# Patient Record
Sex: Female | Born: 1960 | Race: White | Hispanic: No | Marital: Married | Smoking: Current every day smoker
Health system: Southern US, Community
[De-identification: ages and names within clinical notes are randomized; demographics above are authoritative.]

## PROBLEM LIST (undated history)

## (undated) DIAGNOSIS — T7840XA Allergy, unspecified, initial encounter: Secondary | ICD-10-CM

## (undated) DIAGNOSIS — F419 Anxiety disorder, unspecified: Secondary | ICD-10-CM

## (undated) HISTORY — DX: Anxiety disorder, unspecified: F41.9

## (undated) HISTORY — DX: Allergy, unspecified, initial encounter: T78.40XA

## (undated) HISTORY — PX: ABDOMINAL EXPLORATION SURGERY: SHX538

## (undated) HISTORY — PX: APPENDECTOMY: SHX54

---

## 1988-09-02 HISTORY — PX: NOSE SURGERY: SHX723

## 2003-08-17 ENCOUNTER — Other Ambulatory Visit: Admission: RE | Admit: 2003-08-17 | Discharge: 2003-08-17 | Payer: Self-pay | Admitting: *Deleted

## 2004-06-13 ENCOUNTER — Ambulatory Visit (HOSPITAL_COMMUNITY): Admission: RE | Admit: 2004-06-13 | Discharge: 2004-06-13 | Payer: Self-pay

## 2009-12-13 ENCOUNTER — Encounter: Admission: RE | Admit: 2009-12-13 | Discharge: 2009-12-13 | Payer: Self-pay | Admitting: Obstetrics & Gynecology

## 2009-12-21 ENCOUNTER — Ambulatory Visit: Admission: RE | Admit: 2009-12-21 | Discharge: 2009-12-21 | Payer: Self-pay | Admitting: Gynecologic Oncology

## 2010-01-09 ENCOUNTER — Encounter (INDEPENDENT_AMBULATORY_CARE_PROVIDER_SITE_OTHER): Payer: Self-pay | Admitting: Obstetrics & Gynecology

## 2010-01-09 ENCOUNTER — Inpatient Hospital Stay (HOSPITAL_COMMUNITY): Admission: RE | Admit: 2010-01-09 | Discharge: 2010-01-11 | Payer: Self-pay | Admitting: Obstetrics & Gynecology

## 2010-11-20 LAB — CBC
HCT: 41.8 % (ref 36.0–46.0)
Hemoglobin: 13.9 g/dL (ref 12.0–15.0)
MCHC: 33.4 g/dL (ref 30.0–36.0)
MCHC: 33.4 g/dL (ref 30.0–36.0)
Platelets: 276 10*3/uL (ref 150–400)
RBC: 3.99 MIL/uL (ref 3.87–5.11)

## 2010-11-20 LAB — BASIC METABOLIC PANEL
CO2: 27 mEq/L (ref 19–32)
Creatinine, Ser: 0.49 mg/dL (ref 0.4–1.2)
GFR calc Af Amer: >60 mL/min (ref >60)
GFR calc non Af Amer: >60 mL/min (ref >60)
Glucose, Bld: 100 mg/dL — ABNORMAL HIGH (ref 70–99)

## 2010-11-20 LAB — DIFFERENTIAL
Basophils Absolute: 0.1 10*3/uL (ref 0.0–0.1)
Eosinophils Absolute: 0.1 10*3/uL (ref 0.0–0.7)
Eosinophils Relative: 1 % (ref 0–5)
Lymphs Abs: 2.6 10*3/uL (ref 0.7–4.0)
Monocytes Relative: 15 % — ABNORMAL HIGH (ref 3–12)
Neutro Abs: 7.1 10*3/uL (ref 1.7–7.7)

## 2010-11-20 LAB — COMPREHENSIVE METABOLIC PANEL
ALT: 14 U/L (ref 0–35)
AST: 18 U/L (ref 0–37)
BUN: 10 mg/dL (ref 6–23)
CO2: 27 mEq/L (ref 19–32)
Calcium: 9.2 mg/dL (ref 8.4–10.5)
Creatinine, Ser: 0.55 mg/dL (ref 0.4–1.2)
Total Protein: 7.3 g/dL (ref 6.0–8.3)

## 2010-11-20 LAB — CA 125: CA 125: 42.9 U/mL — ABNORMAL HIGH (ref 0.0–30.2)

## 2010-11-20 LAB — PREGNANCY, URINE: Preg Test, Ur: NEGATIVE

## 2010-11-20 LAB — ABO/RH: ABO/RH(D): A POS

## 2011-01-01 HISTORY — PX: ABDOMINAL HYSTERECTOMY: SHX81

## 2011-09-27 ENCOUNTER — Ambulatory Visit (INDEPENDENT_AMBULATORY_CARE_PROVIDER_SITE_OTHER): Payer: Commercial Indemnity | Admitting: Family Medicine

## 2011-09-27 DIAGNOSIS — N949 Unspecified condition associated with female genital organs and menstrual cycle: Secondary | ICD-10-CM

## 2011-09-27 DIAGNOSIS — F17200 Nicotine dependence, unspecified, uncomplicated: Secondary | ICD-10-CM

## 2012-05-02 ENCOUNTER — Ambulatory Visit (INDEPENDENT_AMBULATORY_CARE_PROVIDER_SITE_OTHER): Payer: Commercial Indemnity | Admitting: Family Medicine

## 2012-05-02 VITALS — BP 125/83 | HR 83 | Temp 97.8°F | Resp 18 | Ht 66.5 in | Wt 170.0 lb

## 2012-05-02 DIAGNOSIS — Z79899 Other long term (current) drug therapy: Secondary | ICD-10-CM

## 2012-05-02 DIAGNOSIS — B351 Tinea unguium: Secondary | ICD-10-CM

## 2012-05-02 DIAGNOSIS — J069 Acute upper respiratory infection, unspecified: Secondary | ICD-10-CM

## 2012-05-02 DIAGNOSIS — J309 Allergic rhinitis, unspecified: Secondary | ICD-10-CM

## 2012-05-02 LAB — COMPREHENSIVE METABOLIC PANEL
AST: 17 U/L (ref 0–37)
Alkaline Phosphatase: 99 U/L (ref 39–117)
BUN: 11 mg/dL (ref 6–23)
CO2: 26 mEq/L (ref 19–32)
Creat: 0.58 mg/dL (ref 0.50–1.10)
Glucose, Bld: 91 mg/dL (ref 70–99)
Potassium: 4.3 mEq/L (ref 3.5–5.3)
Sodium: 139 mEq/L (ref 135–145)
Total Bilirubin: 0.5 mg/dL (ref 0.3–1.2)

## 2012-05-02 MED ORDER — TERBINAFINE HCL 250 MG PO TABS: 250.0000 mg | ORAL_TABLET | Freq: Every day | ORAL | Status: AC

## 2012-05-02 MED ORDER — IPRATROPIUM BROMIDE 0.06 % NA SOLN: 2.0000 | Freq: Four times a day (QID) | NASAL | Status: DC | PRN

## 2012-05-02 MED ORDER — AZITHROMYCIN 250 MG PO TABS: ORAL_TABLET | ORAL | Status: AC

## 2012-05-02 NOTE — Progress Notes (Signed)
  Subjective:    Patient ID: Lori Myers, female    DOB: [birth year], 51 y.o.   MRN: [id number]  HPI Lori Myers is a 51 y.o. female  Started few days ago - sneezing and runny nose, pnd and hoarse voice.  Hx of seasonal allergies, takes [facility] brand antihistamine - past few days.  Usually flair of allergies in spring and fall.  No fever.  Husband with similar symptoms.  Discolored/thickened toenails great toe both feet - past 3 months.  Tried 3 different otc treatments - no relief.   TX:  otc antihistamine, fluids.    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  Review of Systems     Objective:   Physical Exam  Constitutional: She is oriented to person, place, and time. She appears well-developed and well-nourished. No distress.  HENT:  Head: Normocephalic and atraumatic.  Right Ear: Hearing, tympanic membrane, external ear and ear canal normal.  Left Ear: Hearing, tympanic membrane, external ear and ear canal normal.  Nose: Nose normal.  Mouth/Throat: Oropharynx is clear and moist. No oropharyngeal exudate.  Eyes: Conjunctivae and EOM are normal. Pupils are equal, round, and reactive to light.  Cardiovascular: Normal rate, regular rhythm, normal heart sounds and intact distal pulses.   No murmur heard. Pulmonary/Chest: Effort normal and breath sounds normal. No respiratory distress. She has no wheezes. She has no rhonchi.  Neurological: She is alert and oriented to person, place, and time.  Skin: Skin is warm and dry. No rash noted.       Thickened, discolored toenails - R great toe, 5th toe, and L great toe, 2nd toe and 5th toe.  No surrounding erythema.   Psychiatric: She has a normal mood and affect. Her behavior is normal.       Assessment & Plan:  Lori Myers is a 51 y.o. female 1. Allergic rhinitis    2. URI (upper respiratory infection)  azithromycin (ZITHROMAX) 250 MG tablet  3. Onychomycosis  terbinafine  (LAMISIL) 250 MG tablet  4. High risk medication use  Comprehensive metabolic panel   URI vs allergy flair - saline ns, continue otc antihistamine.  Trial of atrovent ns if needed.  z pak if not improving in 3-4 days (augmentin allergy), but discussed likely allergic or viral cause. rtc precautions. Tobacco cessation recommended.  Onychomycosis - options discussed.  Start lamisil 250mg  qd.  Recheck cmp in 6 weeks and if ok - call in # 30 additional lamisil.

## 2012-05-02 NOTE — Patient Instructions (Addendum)
Continue antihistamine for allergies.  Saline nasal spray atleast 4 times per day, over the counter mucinex or mucinex DM if needed for cough, drink plenty of fluids. If not improving in 3-4 days, can start antibiotic. Return to the clinic or go to the nearest emergency room if any of your symptoms worsen or new symptoms occur.  Your should receive a call or letter about your lab results within the next week to 10 days.  Lab only visit in 6 weeks for liver test.  If that test is normal, we can call in your last month of Lamisil.   Ringworm, Nail A fungal infection of the nail (tinea unguium/onychomycosis) is common. It is common as the visible part of the nail is composed of dead cells which have no blood supply to help prevent infection. It occurs because fungi are everywhere and will pick any opportunity to grow on any dead material. Because nails are very slow growing they require up to 2 years of treatment with anti-fungal medications. The entire nail back to the base is infected. This includes approximately ? of the nail which you cannot see. If your caregiver has prescribed a medication by mouth, take it every day and as directed. No progress will be seen for at least 6 to 9 months. Do not be disappointed! Because fungi live on dead cells with little or no exposure to blood supply, medication delivery to the infection is slow; thus the cure is slow. It is also why you can observe no progress in the first 6 months. The nail becoming cured is the base of the nail, as it has the blood supply. Topical medication such as creams and ointments are usually not effective. Important in successful treatment of nail fungus is closely following the medication regimen that your doctor prescribes. Sometimes you and your caregiver may elect to speed up this process by surgical removal of all the nails. Even this may still require 6 to 9 months of additional oral medications. See your caregiver as directed. Remember  there will be no visible improvement for at least 6 months. See your caregiver sooner if other signs of infection (redness and swelling) develop. Document Released: 08/16/2000 Document Revised: 08/08/2011 Document Reviewed: 10/25/2008 [facility] Patient Information 2012 [place], [place].

## 2012-06-08 ENCOUNTER — Ambulatory Visit (INDEPENDENT_AMBULATORY_CARE_PROVIDER_SITE_OTHER): Payer: Commercial Indemnity | Admitting: Family Medicine

## 2012-06-08 VITALS — BP 130/84 | HR 67 | Resp 20 | Ht 68.0 in | Wt 178.0 lb

## 2012-06-08 DIAGNOSIS — Z23 Encounter for immunization: Secondary | ICD-10-CM

## 2012-06-08 DIAGNOSIS — M545 Low back pain, unspecified: Secondary | ICD-10-CM

## 2012-06-08 MED ORDER — METAXALONE 800 MG PO TABS: 800.0000 mg | ORAL_TABLET | Freq: Three times a day (TID) | ORAL | Status: DC

## 2012-06-08 MED ORDER — MELOXICAM 15 MG PO TABS: ORAL_TABLET | ORAL | Status: DC

## 2012-06-08 MED ORDER — HYDROCODONE-ACETAMINOPHEN 5-500 MG PO TABS: 1.0000 | ORAL_TABLET | ORAL | Status: DC | PRN

## 2012-06-08 MED ORDER — CYCLOBENZAPRINE HCL 10 MG PO TABS: 10.0000 mg | ORAL_TABLET | Freq: Three times a day (TID) | ORAL | Status: DC | PRN

## 2012-06-08 NOTE — Progress Notes (Signed)
Subjective: Patient move some furniture on Friday. This week he she's had bad pain in her right low back. She is very stiff. She works over 80 miles from here, and couldn't go to work today. She has not had a lot of back pain problems in the past.  Objective: Pleasant alert lady in is very uncomfortable. She's quite tender in the right low back paraspinous muscles on his region. She can only barely flex forward. She can extend better than she can flex. Side to side motion does not get a lot of pain going to the left which he had moderate pain going to the right. Muscle tight. Straight leg raise test is seated is negative. Deep tendon reflexes symmetrical.  Assessment: Low back pain and strain  Plan: Continue the meloxicam one daily the chart he has. Take Lortab when necessary for severe pain. Used the Flexeril 1 3 times a day for muscle relaxants, but if she is going to be operating machinery she is to use the Skelaxin.

## 2012-06-08 NOTE — Patient Instructions (Addendum)
Use the indirect Skelaxin 3 times daily as needed for muscle accident when you are need to be alert. Use the Flexeril 3 times daily when it is okay to be drowsy.  Take the meloxicam one daily with food. Do not take any Advil or Aleve or ibuprofen when you are on the meloxicam. You can take additional Tylenol if you need to have something different for pain  Hydrocodone for severe pain only one every 4 hours  Alternate ice and heat  Return if worse

## 2012-08-05 ENCOUNTER — Encounter: Payer: Commercial Indemnity | Admitting: Family Medicine

## 2015-03-28 ENCOUNTER — Encounter: Payer: Self-pay | Admitting: Family Medicine

## 2015-03-28 ENCOUNTER — Ambulatory Visit (INDEPENDENT_AMBULATORY_CARE_PROVIDER_SITE_OTHER): Payer: 59 | Admitting: Family Medicine

## 2015-03-28 VITALS — BP 136/84 | HR 86 | Temp 98.2°F | Resp 16 | Wt 181.6 lb

## 2015-03-28 DIAGNOSIS — S81801A Unspecified open wound, right lower leg, initial encounter: Secondary | ICD-10-CM

## 2015-03-28 DIAGNOSIS — F411 Generalized anxiety disorder: Secondary | ICD-10-CM | POA: Diagnosis not present

## 2015-03-28 MED ORDER — DIAZEPAM 5 MG PO TABS: 5.0000 mg | ORAL_TABLET | Freq: Two times a day (BID) | ORAL | Status: DC | PRN

## 2015-03-28 NOTE — Progress Notes (Signed)
   Subjective:    Patient ID: Lori Myers, female    DOB: [birth year], 54 y.o.   MRN: [id number]  HPI This is pleasant 54 yo female who presents today with a wound on her right knee. The patient fell 16 days ago on the side walk. She scraped her knee. The lesion was abraded and bleeding. She washed it out with H2O2 and applied neosporyn. It developed a hard scab. She removed the scab when it became loose and  continued to use H2O2 daily and then starting applying calmosptine ointment. It continued to improve until last night when it started to hurt and get red. She has been keeping it covered when she wears pants. It is not swollen or draining.   She has not had any regular healthcare for several years. She reports that she rarely gets sick. She is requesting some Valium for her anxiety. 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 She smokes 1ppd. She works long hours, does not exercise regularly.   No past medical history on file. No past surgical history on file. No family history on file. History  Substance Use Topics  . Smoking status: Current Every Day Smoker  . Smokeless tobacco: Not on file  . Alcohol Use: Not on file  Medications, allergies, past medical history, surgical history, family history, social history and problem list reviewed and updated.  Review of Systems No fever or chills, no chest pain, no SOB, no cough/wheeze.     Objective:   Physical Exam  Constitutional: She is oriented to person, place, and time. She appears well-developed and well-nourished.  HENT:  Head: Normocephalic and atraumatic.  Eyes: Conjunctivae are normal.  Neck: Normal range of motion. Neck supple.  Cardiovascular: Normal rate.   Pulmonary/Chest: Effort normal.  Musculoskeletal: Normal range of motion.  Neurological: She is alert and oriented to person, place, and time.  Skin: Skin is warm and dry. Abrasion noted.  Approximately 3 cm area of abrasion  below right patella. No swelling or drainage. Appears to be healing appropriately.   Psychiatric: She has a normal mood and affect. Her behavior is normal. Judgment and thought content normal.  Vitals reviewed.  BP 136/84 mmHg  Pulse 86  Temp(Src) 98.2 F (36.8 C) (Oral)  Resp 16  Wt 181 lb 9.6 oz (82.373 kg)     Assessment & Plan:  1. Anxiety state - Discussed potential for dependence on diazepam and encouraged her to use alternative strategies to deal with her stress and anxiety. - diazepam (VALIUM) 5 MG tablet; Take 1 tablet (5 mg total) by mouth every 12 (twelve) hours as needed for anxiety.  Dispense: 20 tablet; Refill: 0  2. Leg wound, right, initial encounter - examined with Dr. Cleta Alberts - reassured patient that wound is healing and that it may be painful for awhile due to depth of abrasion - encouraged her to wash 1-2 x daily with mild soap and water and leave open to air when possible - can apply vasoline if it becomes dry  - RTC if fever/drainage/edema/erythema  3. Tobacco abuse - Provided written and verbal information regarding diagnosis and treatment. - encouraged smoking cessation  - follow up CPE in 3 months   Olean Ree, FNP-BC  [facility], [facility]  03/29/2015 9:22 PM

## 2015-03-28 NOTE — Patient Instructions (Signed)
Please wash your wound 2-3 time a day with soap and water. Leave it open to air when you can Can apply a small amount of vasoline if it gets dry and pulls Try ibuprofen 2 tablets twice a day for pain  Good luck with smoking cessation Smoking Cessation, Tips for Success If you are ready to quit smoking, congratulations! You have chosen to help yourself be healthier. Cigarettes bring nicotine, tar, carbon monoxide, and other irritants into your body. Your lungs, heart, and blood vessels will be able to work better without these poisons. There are many different ways to quit smoking. Nicotine gum, nicotine patches, a nicotine inhaler, or nicotine nasal spray can help with physical craving. Hypnosis, support groups, and medicines help break the habit of smoking. WHAT THINGS CAN I DO TO MAKE QUITTING EASIER?  Here are some tips to help you quit for good:  Pick a date when you will quit smoking completely. Tell all of your friends and family about your plan to quit on that date.  Do not try to slowly cut down on the number of cigarettes you are smoking. Pick a quit date and quit smoking completely starting on that day.  Throw away all cigarettes.   Clean and remove all ashtrays from your home, work, and car.  On a card, write down your reasons for quitting. Carry the card with you and read it when you get the urge to smoke.  Cleanse your body of nicotine. Drink enough water and fluids to keep your urine clear or pale yellow. Do this after quitting to flush the nicotine from your body.  Learn to predict your moods. Do not let a bad situation be your excuse to have a cigarette. Some situations in your life might tempt you into wanting a cigarette.  Never have "just one" cigarette. It leads to wanting another and another. Remind yourself of your decision to quit.  Change habits associated with smoking. If you smoked while driving or when feeling stressed, try other activities to replace smoking.  Stand up when drinking your coffee. Brush your teeth after eating. Sit in a different chair when you read the paper. Avoid alcohol while trying to quit, and try to drink fewer caffeinated beverages. Alcohol and caffeine may urge you to smoke.  Avoid foods and drinks that can trigger a desire to smoke, such as sugary or spicy foods and alcohol.  Ask people who smoke not to smoke around you.  Have something planned to do right after eating or having a cup of coffee. For example, plan to take a walk or exercise.  Try a relaxation exercise to calm you down and decrease your stress. Remember, you may be tense and nervous for the first 2 weeks after you quit, but this will pass.  Find new activities to keep your hands busy. Play with a pen, coin, or rubber band. Doodle or draw things on paper.  Brush your teeth right after eating. This will help cut down on the craving for the taste of tobacco after meals. You can also try mouthwash.   Use oral substitutes in place of cigarettes. Try using lemon drops, carrots, cinnamon sticks, or chewing gum. Keep them handy so they are available when you have the urge to smoke.  When you have the urge to smoke, try deep breathing.  Designate your home as a nonsmoking area.  If you are a heavy smoker, ask your health care provider about a prescription for nicotine chewing gum. It  can ease your withdrawal from nicotine.  Reward yourself. Set aside the cigarette money you save and buy yourself something nice.  Look for support from others. Join a support group or smoking cessation program. Ask someone at home or at work to help you with your plan to quit smoking.  Always ask yourself, "Do I need this cigarette or is this just a reflex?" Tell yourself, "Today, I choose not to smoke," or "I do not want to smoke." You are reminding yourself of your decision to quit.  Do not replace cigarette smoking with electronic cigarettes (commonly called e-cigarettes). The  safety of e-cigarettes is unknown, and some may contain harmful chemicals.  If you relapse, do not give up! Plan ahead and think about what you will do the next time you get the urge to smoke. HOW WILL I FEEL WHEN I QUIT SMOKING? You may have symptoms of withdrawal because your body is used to nicotine (the addictive substance in cigarettes). You may crave cigarettes, be irritable, feel very hungry, cough often, get headaches, or have difficulty concentrating. The withdrawal symptoms are only temporary. They are strongest when you first quit but will go away within 10-14 days. When withdrawal symptoms occur, stay in control. Think about your reasons for quitting. Remind yourself that these are signs that your body is healing and getting used to being without cigarettes. Remember that withdrawal symptoms are easier to treat than the major diseases that smoking can cause.  Even after the withdrawal is over, expect periodic urges to smoke. However, these cravings are generally short lived and will go away whether you smoke or not. Do not smoke! WHAT RESOURCES ARE AVAILABLE TO HELP ME QUIT SMOKING? Your health care provider can direct you to community resources or hospitals for support, which may include:  Group support.  Education.  Hypnosis.  Therapy. Document Released: 05/17/2004 Document Revised: 01/03/2014 Document Reviewed: 02/04/2013 [facility] Patient Information 2015 [place], [place]. This information is not intended to replace advice given to you by your health care provider. Make sure you discuss any questions you have with your health care provider.

## 2015-06-20 ENCOUNTER — Encounter: Payer: 59 | Admitting: Family Medicine

## 2015-06-28 ENCOUNTER — Encounter: Payer: Self-pay | Admitting: Family Medicine

## 2015-06-28 ENCOUNTER — Ambulatory Visit (INDEPENDENT_AMBULATORY_CARE_PROVIDER_SITE_OTHER): Payer: 59 | Admitting: Family Medicine

## 2015-06-28 ENCOUNTER — Other Ambulatory Visit: Payer: Self-pay

## 2015-06-28 ENCOUNTER — Encounter: Payer: Self-pay | Admitting: Internal Medicine

## 2015-06-28 VITALS — BP 121/81 | HR 87 | Temp 97.9°F | Resp 16 | Ht 66.5 in | Wt 181.6 lb

## 2015-06-28 DIAGNOSIS — Z139 Encounter for screening, unspecified: Secondary | ICD-10-CM | POA: Diagnosis not present

## 2015-06-28 DIAGNOSIS — L989 Disorder of the skin and subcutaneous tissue, unspecified: Secondary | ICD-10-CM

## 2015-06-28 DIAGNOSIS — Z23 Encounter for immunization: Secondary | ICD-10-CM

## 2015-06-28 DIAGNOSIS — Z130 Encounter for screening for diseases of the blood and blood-forming organs and certain disorders involving the immune mechanism: Secondary | ICD-10-CM

## 2015-06-28 DIAGNOSIS — F411 Generalized anxiety disorder: Secondary | ICD-10-CM

## 2015-06-28 DIAGNOSIS — Z720 Tobacco use: Secondary | ICD-10-CM

## 2015-06-28 DIAGNOSIS — Z1211 Encounter for screening for malignant neoplasm of colon: Secondary | ICD-10-CM | POA: Diagnosis not present

## 2015-06-28 DIAGNOSIS — Z1321 Encounter for screening for nutritional disorder: Secondary | ICD-10-CM | POA: Diagnosis not present

## 2015-06-28 DIAGNOSIS — Z0000 Encounter for general adult medical examination without abnormal findings: Secondary | ICD-10-CM

## 2015-06-28 DIAGNOSIS — Z1389 Encounter for screening for other disorder: Secondary | ICD-10-CM

## 2015-06-28 DIAGNOSIS — Z6828 Body mass index (BMI) 28.0-28.9, adult: Secondary | ICD-10-CM | POA: Diagnosis not present

## 2015-06-28 DIAGNOSIS — Z1239 Encounter for other screening for malignant neoplasm of breast: Secondary | ICD-10-CM

## 2015-06-28 DIAGNOSIS — Z1231 Encounter for screening mammogram for malignant neoplasm of breast: Secondary | ICD-10-CM

## 2015-06-28 LAB — COMPREHENSIVE METABOLIC PANEL
ALT: 17 U/L (ref 6–29)
AST: 20 U/L (ref 10–35)
Albumin: 4.4 g/dL (ref 3.6–5.1)
Alkaline Phosphatase: 112 U/L (ref 33–130)
BUN: 12 mg/dL (ref 7–25)
CHLORIDE: 106 mmol/L (ref 98–110)
CO2: 24 mmol/L (ref 20–31)
CREATININE: 0.62 mg/dL (ref 0.50–1.05)
Calcium: 9.4 mg/dL (ref 8.6–10.4)
GLUCOSE: 97 mg/dL (ref 65–99)
Potassium: 4.7 mmol/L (ref 3.5–5.3)
SODIUM: 140 mmol/L (ref 135–146)
TOTAL PROTEIN: 7 g/dL (ref 6.1–8.1)
Total Bilirubin: 0.5 mg/dL (ref 0.2–1.2)

## 2015-06-28 LAB — POCT URINALYSIS DIP (MANUAL ENTRY)
BILIRUBIN UA: NEGATIVE
Bilirubin, UA: NEGATIVE
Glucose, UA: NEGATIVE
NITRITE UA: NEGATIVE
PH UA: 5
PROTEIN UA: NEGATIVE
RBC UA: NEGATIVE
Spec Grav, UA: 1.025
Urobilinogen, UA: 0.2

## 2015-06-28 LAB — CBC
HCT: 44.1 % (ref 36.0–46.0)
Hemoglobin: 15.3 g/dL — ABNORMAL HIGH (ref 12.0–15.0)
MCH: 30.5 pg (ref 26.0–34.0)
MCHC: 34.7 g/dL (ref 30.0–36.0)
MCV: 87.8 fL (ref 78.0–100.0)
MPV: 8.6 fL (ref 8.6–12.4)
PLATELETS: 410 10*3/uL — AB (ref 150–400)
RBC: 5.02 MIL/uL (ref 3.87–5.11)
RDW: 12.9 % (ref 11.5–15.5)
WBC: 7.7 10*3/uL (ref 4.0–10.5)

## 2015-06-28 LAB — LIPID PANEL
CHOL/HDL RATIO: 3.8 ratio (ref <=5.0)
CHOLESTEROL: 247 mg/dL — AB (ref 125–200)
HDL: 65 mg/dL (ref >=46)
LDL CALC: 144 mg/dL — AB (ref <130)
Triglycerides: 190 mg/dL — ABNORMAL HIGH (ref <150)
VLDL: 38 mg/dL — AB (ref <30)

## 2015-06-28 LAB — TSH: TSH: 0.783 u[IU]/mL (ref 0.350–4.500)

## 2015-06-28 MED ORDER — DIAZEPAM 5 MG PO TABS: 5.0000 mg | ORAL_TABLET | Freq: Two times a day (BID) | ORAL | Status: DC | PRN

## 2015-06-28 NOTE — Progress Notes (Signed)
Subjective:    Patient ID: Lori Myers, female    DOB: 1961/06/25, 54 y.o.   MRN: [id number]  HPI This is a pleasant 54 yo female who presents today for CPE. She has been doing ok since I last saw her. 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   Last CPE- several years Mammo- 2 years Pap- hysterectomy Colonoscopy- willing to have Tdap- today Flu- today Eye- regular Dental-regular Exercise- very active at home and work, no regular exercise   Review of Systems  Constitutional: Negative.   HENT: Positive for congestion, postnasal drip, rhinorrhea and sinus pressure.   Eyes: Negative.   Respiratory: Negative.   Cardiovascular: Negative.   Gastrointestinal: Negative.   Endocrine: Negative.   Genitourinary: Negative.   Musculoskeletal: Negative.   Skin: Negative.   Allergic/Immunologic: Positive for environmental allergies.  Hematological: Negative.   Psychiatric/Behavioral: The patient is nervous/anxious (occasional anxiety, takes rare valium.).       Objective:   Physical Exam Physical Exam  Constitutional: She is oriented to person, place, and time. She appears well-developed and well-nourished. No distress.  HENT:  Head: Normocephalic and atraumatic.  Right Ear: External ear normal.  Left Ear: External ear normal.  Nose: Nose normal.  Mouth/Throat: Oropharynx is clear and moist. No oropharyngeal exudate.  Eyes: Conjunctivae are normal. Pupils are equal, round, and reactive to light.  Neck: Normal range of motion. Neck supple. No JVD present. No thyromegaly present.  Cardiovascular: Normal rate, regular rhythm, normal heart sounds and intact distal pulses.   Pulmonary/Chest: Effort normal and breath sounds normal. Right breast exhibits no inverted nipple, no mass, no nipple discharge, no skin change and no tenderness. Left breast exhibits no inverted nipple, no mass, no nipple discharge, no skin change and no  tenderness. Breasts are symmetrical.  Abdominal: Soft. Bowel sounds are normal. She exhibits no distension and no mass. There is no tenderness. There is no rebound and no guarding.  Genitourinary: Vagina normal. Pelvic exam was performed with patient supine. There is no rash, tenderness, lesion or injury on the right labia. There is no rash, tenderness, lesion or injury on the left labia. Cervix surgically absent. Small amount white vaginal discharge found.  Musculoskeletal: Normal range of motion. She exhibits no edema or tenderness.  Lymphadenopathy:    She has no cervical adenopathy.  Neurological: She is alert and oriented to person, place, and time. She has normal reflexes.  Skin: Skin is warm and dry. She is not diaphoretic. Right side of mid back with approximately 1.5 cm round, dark, raised, scaly lesion. Similar smaller lesion (approximately 8 mm) on left side of mid back.  Psychiatric: She has a normal mood and affect. Her behavior is normal. Judgment and thought content normal.  Vitals reviewed.  BP 121/81 mmHg  Pulse 87  Temp(Src) 97.9 F (36.6 C) (Oral)  Resp 16  Ht 5' 6.5" (1.689 m)  Wt 181 lb 9.6 oz (82.373 kg)  BMI 28.88 kg/m2 Wt Readings from Last 3 Encounters:  06/28/15 181 lb 9.6 oz (82.373 kg)  03/28/15 181 lb 9.6 oz (82.373 kg)  06/08/12 178 lb (80.74 kg)   [facility] 06/28/2015 03/28/2015  Decreased Interest 0 0  Down, Depressed, Hopeless 1 0  PHQ - 2 Score 1 0      Assessment & Plan:  1. Annual physical exam  2. BMI 28.0-28.9,adult - Lipid panel - TSH  3. Anxiety state -  diazepam (VALIUM) 5 MG tablet; Take 1 tablet (5 mg total) by mouth every 12 (twelve) hours as needed for anxiety.  Dispense: 20 tablet; Refill: 1  4. Tobacco abuse disorder - POCT urinalysis dipstick - encouraged smoking cessation and provided written tips for success.   5. Special screening for malignant neoplasms, colon - Ambulatory referral to Gastroenterology  6.  Screening for breast cancer - MM Digital Screening; Future  7. Screening for deficiency anemia - CBC  8. Screening for hematuria or proteinuria - POCT urinalysis dipstick  9. Screening for nephropathy - Comprehensive metabolic panel  10. Skin lesion of back - Ambulatory referral to Dermatology  11. Encounter for vitamin deficiency screening - Vit D  25 hydroxy (rtn osteoporosis monitoring)  12. Flu vaccine need - Flu Vaccine QUAD 36+ mos IM  13. Need for Tdap vaccination - Tdap vaccine greater than or equal to 7yo IM   Olean Ree, FNP-BC  [facility], [facility]  06/28/2015 11:06 AM

## 2015-06-28 NOTE — Patient Instructions (Addendum)
For sinus pressure, early sinus infection symptoms, try to take sudafed (regular, generic) in the morning and early afternoon and use Afrin twice a day for 3 days Can also use saline nasal spray as much as needed. Smoking Cessation, Tips for Success If you are ready to quit smoking, congratulations! You have chosen to help yourself be healthier. Cigarettes bring nicotine, tar, carbon monoxide, and other irritants into your body. Your lungs, heart, and blood vessels will be able to work better without these poisons. There are many different ways to quit smoking. Nicotine gum, nicotine patches, a nicotine inhaler, or nicotine nasal spray can help with physical craving. Hypnosis, support groups, and medicines help break the habit of smoking. WHAT THINGS CAN I DO TO MAKE QUITTING EASIER?  Here are some tips to help you quit for good:  Pick a date when you will quit smoking completely. Tell all of your friends and family about your plan to quit on that date.  Do not try to slowly cut down on the number of cigarettes you are smoking. Pick a quit date and quit smoking completely starting on that day.  Throw away all cigarettes.   Clean and remove all ashtrays from your home, work, and car.  On a card, write down your reasons for quitting. Carry the card with you and read it when you get the urge to smoke.  Cleanse your body of nicotine. Drink enough water and fluids to keep your urine clear or pale yellow. Do this after quitting to flush the nicotine from your body.  Learn to predict your moods. Do not let a bad situation be your excuse to have a cigarette. Some situations in your life might tempt you into wanting a cigarette.  Never have "just one" cigarette. It leads to wanting another and another. Remind yourself of your decision to quit.  Change habits associated with smoking. If you smoked while driving or when feeling stressed, try other activities to replace smoking. Stand up when drinking  your coffee. Brush your teeth after eating. Sit in a different chair when you read the paper. Avoid alcohol while trying to quit, and try to drink fewer caffeinated beverages. Alcohol and caffeine may urge you to smoke.  Avoid foods and drinks that can trigger a desire to smoke, such as sugary or spicy foods and alcohol.  Ask people who smoke not to smoke around you.  Have something planned to do right after eating or having a cup of coffee. For example, plan to take a walk or exercise.  Try a relaxation exercise to calm you down and decrease your stress. Remember, you may be tense and nervous for the first 2 weeks after you quit, but this will pass.  Find new activities to keep your hands busy. Play with a pen, coin, or rubber band. Doodle or draw things on paper.  Brush your teeth right after eating. This will help cut down on the craving for the taste of tobacco after meals. You can also try mouthwash.   Use oral substitutes in place of cigarettes. Try using lemon drops, carrots, cinnamon sticks, or chewing gum. Keep them handy so they are available when you have the urge to smoke.  When you have the urge to smoke, try deep breathing.  Designate your home as a nonsmoking area.  If you are a heavy smoker, ask your health care provider about a prescription for nicotine chewing gum. It can ease your withdrawal from nicotine.  Reward yourself. Set  aside the cigarette money you save and buy yourself something nice.  Look for support from others. Join a support group or smoking cessation program. Ask someone at home or at work to help you with your plan to quit smoking.  Always ask yourself, "Do I need this cigarette or is this just a reflex?" Tell yourself, "Today, I choose not to smoke," or "I do not want to smoke." You are reminding yourself of your decision to quit.  Do not replace cigarette smoking with electronic cigarettes (commonly called e-cigarettes). The safety of e-cigarettes is  unknown, and some may contain harmful chemicals.  If you relapse, do not give up! Plan ahead and think about what you will do the next time you get the urge to smoke. HOW WILL I FEEL WHEN I QUIT SMOKING? You may have symptoms of withdrawal because your body is used to nicotine (the addictive substance in cigarettes). You may crave cigarettes, be irritable, feel very hungry, cough often, get headaches, or have difficulty concentrating. The withdrawal symptoms are only temporary. They are strongest when you first quit but will go away within 10-14 days. When withdrawal symptoms occur, stay in control. Think about your reasons for quitting. Remind yourself that these are signs that your body is healing and getting used to being without cigarettes. Remember that withdrawal symptoms are easier to treat than the major diseases that smoking can cause.  Even after the withdrawal is over, expect periodic urges to smoke. However, these cravings are generally short lived and will go away whether you smoke or not. Do not smoke! WHAT RESOURCES ARE AVAILABLE TO HELP ME QUIT SMOKING? Your health care provider can direct you to community resources or hospitals for support, which may include:  Group support.  Education.  Hypnosis.  Therapy.   This information is not intended to replace advice given to you by your health care provider. Make sure you discuss any questions you have with your health care provider.   Document Released: 05/17/2004 Document Revised: 09/09/2014 Document Reviewed: 02/04/2013 Elsevier Interactive Patient Education [employer]2016 Elsevier Inc.

## 2015-06-29 ENCOUNTER — Encounter: Payer: Self-pay | Admitting: Family Medicine

## 2015-06-29 LAB — VITAMIN D 25 HYDROXY (VIT D DEFICIENCY, FRACTURES): VIT D 25 HYDROXY: 43 ng/mL (ref 30–100)

## 2015-08-01 ENCOUNTER — Ambulatory Visit
Admission: RE | Admit: 2015-08-01 | Discharge: 2015-08-01 | Disposition: A | Discharge status: Home / Self Care | Payer: 59 | Source: Ambulatory Visit | Attending: Family Medicine | Admitting: Family Medicine

## 2015-08-01 DIAGNOSIS — Z1231 Encounter for screening mammogram for malignant neoplasm of breast: Secondary | ICD-10-CM

## 2015-08-25 ENCOUNTER — Ambulatory Visit (AMBULATORY_SURGERY_CENTER): Payer: 59 | Admitting: *Deleted

## 2015-08-25 VITALS — Ht 66.0 in | Wt 183.0 lb

## 2015-08-25 DIAGNOSIS — Z1211 Encounter for screening for malignant neoplasm of colon: Secondary | ICD-10-CM

## 2015-08-25 NOTE — Progress Notes (Signed)
No egg or soy allergy known to patient  No issues with past sedation with any surgeries  or procedures, no intubation problems  No diet pills per pt.  No home 02 use per patient   [email or web address] video to this e mail

## 2015-09-08 ENCOUNTER — Ambulatory Visit (AMBULATORY_SURGERY_CENTER): Payer: 59 | Admitting: Internal Medicine

## 2015-09-08 ENCOUNTER — Encounter: Payer: Self-pay | Admitting: Internal Medicine

## 2015-09-08 VITALS — BP 127/93 | HR 67 | Temp 98.0°F | Resp 18 | Ht 66.0 in | Wt 183.0 lb

## 2015-09-08 DIAGNOSIS — Q2733 Arteriovenous malformation of digestive system vessel: Secondary | ICD-10-CM | POA: Diagnosis not present

## 2015-09-08 DIAGNOSIS — Z1211 Encounter for screening for malignant neoplasm of colon: Secondary | ICD-10-CM

## 2015-09-08 DIAGNOSIS — K5520 Angiodysplasia of colon without hemorrhage: Secondary | ICD-10-CM

## 2015-09-08 MED ORDER — SODIUM CHLORIDE 0.9 % IV SOLN: 500.0000 mL | INTRAVENOUS | Status: DC

## 2015-09-08 NOTE — Progress Notes (Signed)
Stable to RR 

## 2015-09-08 NOTE — Patient Instructions (Addendum)
No polyps found. You do have a condition called diverticulosis - common and not usually a problem. Please read the handout provided. 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 These can bleed but do not cause cancer. Unlikely to ever cause problems - if it does we can usually fix it.  Next routine colon cancer screening in 10 years - 2027  I appreciate the opportunity to care for you. Iva Booparl E. Gessner, MD, [facility]   Discharge instructions given. Handout on diverticulosis. Resume previous medications. YOU HAD AN ENDOSCOPIC PROCEDURE TODAY AT THE [facility]:   Refer to the procedure report that was given to you for any specific questions about what was found during the examination.  If the procedure report does not answer your questions, please call your gastroenterologist to clarify.  If you requested that your care partner not be given the details of your procedure findings, then the procedure report has been included in a sealed envelope for you to review at your convenience later.  YOU SHOULD EXPECT: Some feelings of bloating in the abdomen. Passage of more gas than usual.  Walking can help get rid of the air that was put into your GI tract during the procedure and reduce the bloating. If you had a lower endoscopy (such as a colonoscopy or flexible sigmoidoscopy) you may notice spotting of blood in your stool or on the toilet paper. If you underwent a bowel prep for your procedure, you may not have a normal bowel movement for a few days.  Please Note:  You might notice some irritation and congestion in your nose or some drainage.  This is from the oxygen used during your procedure.  There is no need for concern and it should clear up in a day or so.  SYMPTOMS TO REPORT IMMEDIATELY:   Following lower endoscopy (colonoscopy or flexible sigmoidoscopy):  Excessive amounts of blood in the stool  Significant tenderness or worsening of abdominal  pains  Swelling of the abdomen that is new, acute  Fever of 100F or higher   For urgent or emergent issues, a gastroenterologist can be reached at any hour by calling [phone].   DIET: Your first meal following the procedure should be a small meal and then it is ok to progress to your normal diet. Heavy or fried foods are harder to digest and may make you feel nauseous or bloated.  Likewise, meals heavy in dairy and vegetables can increase bloating.  Drink plenty of fluids but you should avoid alcoholic beverages for 24 hours.  ACTIVITY:  You should plan to take it easy for the rest of today and you should NOT DRIVE or use heavy machinery until tomorrow (because of the sedation medicines used during the test).    FOLLOW UP: Our staff will call the number listed on your records the next business day following your procedure to check on you and address any questions or concerns that you may have regarding the information given to you following your procedure. If we do not reach you, we will leave a message.  However, if you are feeling well and you are not experiencing any problems, there is no need to return our call.  We will assume that you have returned to your regular daily activities without incident.  If any biopsies were taken you will be contacted by phone or by letter within the next 1-3 weeks.  Please call us at [phone] if you have not  heard about the biopsies in 3 weeks.    SIGNATURES/CONFIDENTIALITY: You and/or your care partner have signed paperwork which will be entered into your electronic medical record.  These signatures attest to the fact that that the information above on your After Visit Summary has been reviewed and is understood.  Full responsibility of the confidentiality of this discharge information lies with you and/or your care-partner.

## 2015-09-08 NOTE — Op Note (Addendum)
[facility] [address] [place], [id number]   COLONOSCOPY PROCEDURE REPORT  PATIENT: Lori Myers, Lori Myers  MR#: [id number] BIRTHDATE: 25-Jul-1961 , 54  yrs. old GENDER: female ENDOSCOPIST: Iva Booparl E Gessner, MD, [facility] PROCEDURE DATE:  09/08/2015 PROCEDURE:   Colonoscopy, screening First Screening Colonoscopy - Avg.  risk and is 50 yrs.  old or older Yes.  Prior Negative Screening - Now for repeat screening. N/A  History of Adenoma - Now for follow-up colonoscopy & has been > or = to 3 yrs.  N/A  Polyps removed today? No Recommend repeat exam, <10 yrs? No ASA CLASS:   Class II INDICATIONS:Screening for colonic neoplasia and Colorectal Neoplasm Risk Assessment for this procedure is average risk. MEDICATIONS: Propofol 280 mg IV, Monitored anesthesia care, and Lidocaine 40 mg IV  DESCRIPTION OF PROCEDURE:   After the risks benefits and alternatives of the procedure were thoroughly explained, informed consent was obtained.  The digital rectal exam revealed no abnormalities of the rectum.   The LB [id number]  endoscope was introduced through the anus and advanced to the cecum, which was identified by both the appendix and ileocecal valve. No adverse events experienced.   The quality of the prep was excellent. (MiraLax was used)  The instrument was then slowly withdrawn as the colon was fully examined. Estimated blood loss is zero unless otherwise noted in this procedure report.      COLON FINDINGS: There was diverticulosis noted in the sigmoid colon. 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  The examination was otherwise normal.  Retroflexed views revealed no abnormalities. The time to cecum = 2.3 Withdrawal time = 10.1   The scope was withdrawn and the procedure completed. COMPLICATIONS: There were no immediate complications.  ENDOSCOPIC IMPRESSION: 1.   Diverticulosis was noted in the sigmoid colon 2.   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   The examination  was otherwise normal - excellent prep - first screening  RECOMMENDATIONS: Repeat colon cancer screening test 2027 -  10 years.  eSigned:  Iva Booparl E Gessner, MD, [facility] 09/08/2015 9:22 AM Revised: 09/08/2015 9:22 AM  cc: The Patient and Olean Reeeborah Gessner, FNP

## 2015-09-12 ENCOUNTER — Telehealth: Payer: Self-pay | Admitting: *Deleted

## 2015-09-12 NOTE — Telephone Encounter (Signed)
  Follow up Call-  Call back number 09/08/2015  Post procedure Call Back phone  # [phone]  Permission to leave phone message Yes    [facility]

## 2016-05-15 ENCOUNTER — Ambulatory Visit (INDEPENDENT_AMBULATORY_CARE_PROVIDER_SITE_OTHER): Payer: 59 | Admitting: Family Medicine

## 2016-05-15 VITALS — BP 126/84 | HR 93 | Temp 98.1°F | Resp 18 | Ht 66.0 in | Wt 179.0 lb

## 2016-05-15 DIAGNOSIS — M545 Low back pain, unspecified: Secondary | ICD-10-CM

## 2016-05-15 MED ORDER — CYCLOBENZAPRINE HCL 10 MG PO TABS: 10.0000 mg | ORAL_TABLET | Freq: Every evening | ORAL | 0 refills | Status: DC | PRN

## 2016-05-15 MED ORDER — HYDROCODONE-ACETAMINOPHEN 5-325 MG PO TABS: 1.0000 | ORAL_TABLET | Freq: Four times a day (QID) | ORAL | 0 refills | Status: DC | PRN

## 2016-05-15 MED ORDER — TRAMADOL HCL 50 MG PO TABS: 50.0000 mg | ORAL_TABLET | Freq: Three times a day (TID) | ORAL | 0 refills | Status: DC | PRN
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
## 2016-05-15 NOTE — Patient Instructions (Addendum)
You do have a muscle strain in your back.   The mainstays of therapy are increasing your activity as tolerated, continuing with heat and ice, pain relief, and massage.    You should start feeling better in the next week or so.    Take the flexeril as needed for muscle spasms.   Take the Ibuprofen for both pain and inflammation.  You can take this up to every 8 hours if you need to.    If you're not feeling better in about 2 weeks come back and see us. If you're feeling worse, don't wait.    IF you received an x-ray today, you will receive an invoice from [facility]. Please contact [facility] at [phone] with questions or concerns regarding your invoice.   IF you received labwork today, you will receive an invoice from [facility]. Please contact [facility] at [phone] with questions or concerns regarding your invoice.   Our billing staff will not be able to assist you with questions regarding bills from these companies.  You will be contacted with the lab results as soon as they are available. The fastest way to get your results is to activate your My Chart account. Instructions are located on the last page of this paperwork. If you have not heard from us regarding the results in 2 weeks, please contact this office.

## 2016-07-02 ENCOUNTER — Other Ambulatory Visit: Payer: Self-pay | Admitting: Family Medicine

## 2017-01-04 IMAGING — MG MM SCREEN MAMMOGRAM BILATERAL
4 series · 4 of 4 positions shown · non-contrast
Comparison: Previous exam(s).

CLINICAL DATA: Screening.

EXAM:
DIGITAL SCREENING BILATERAL MAMMOGRAM WITH CAD

[R CC]
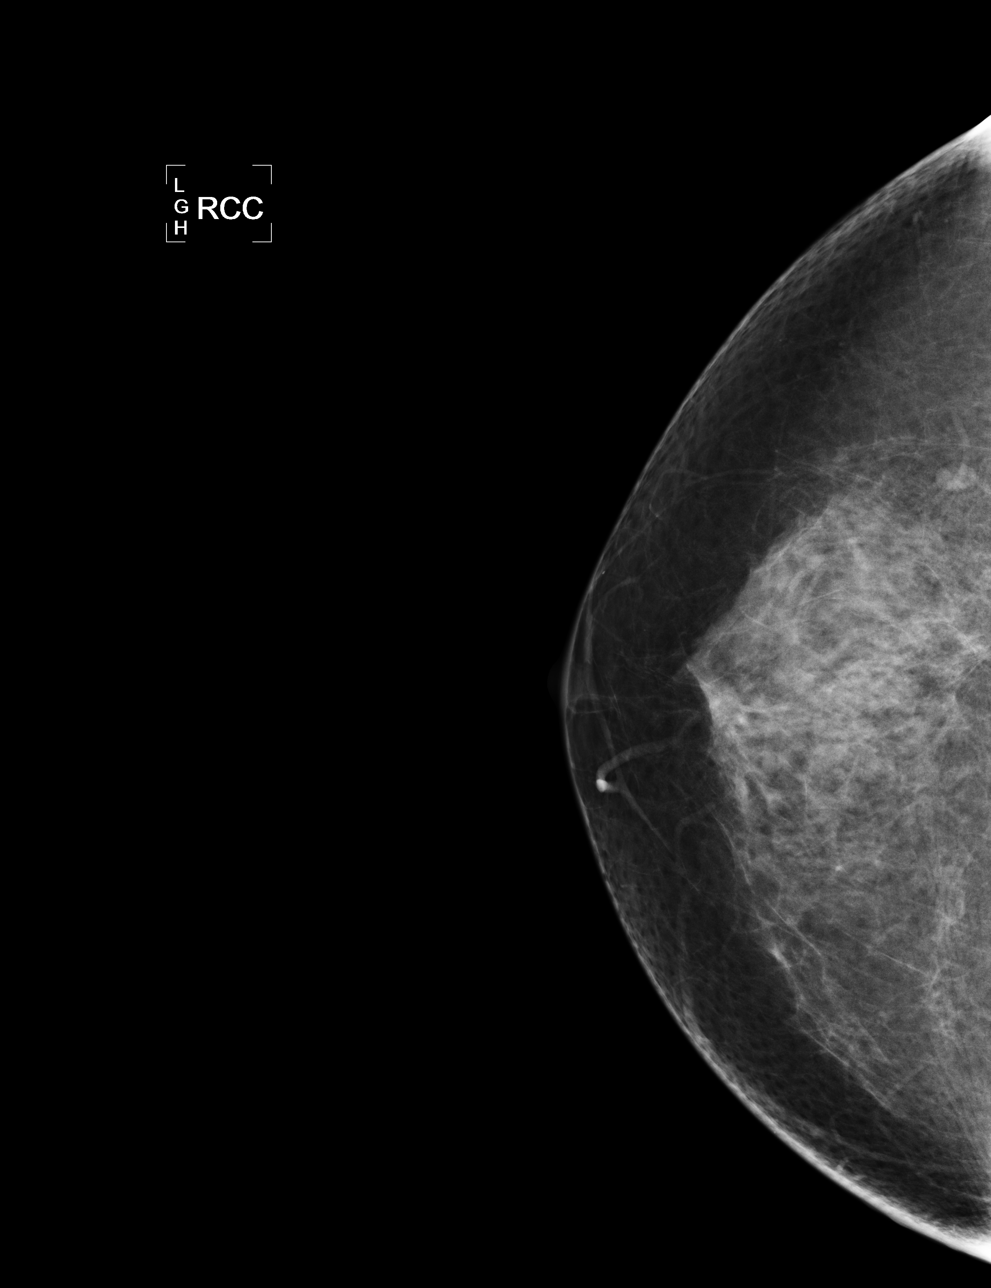

[L CC]
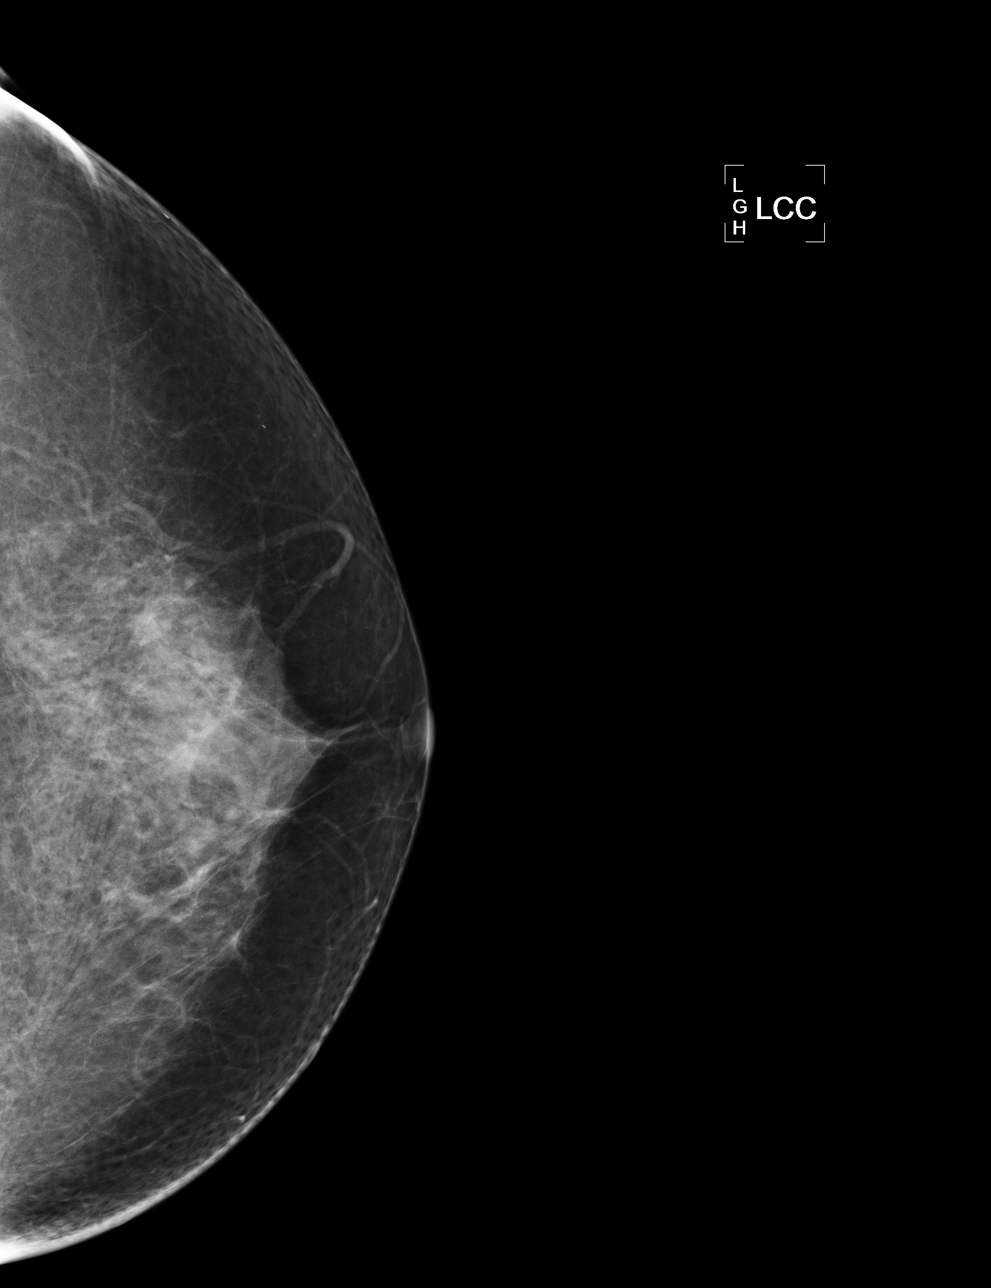

[L MLO]
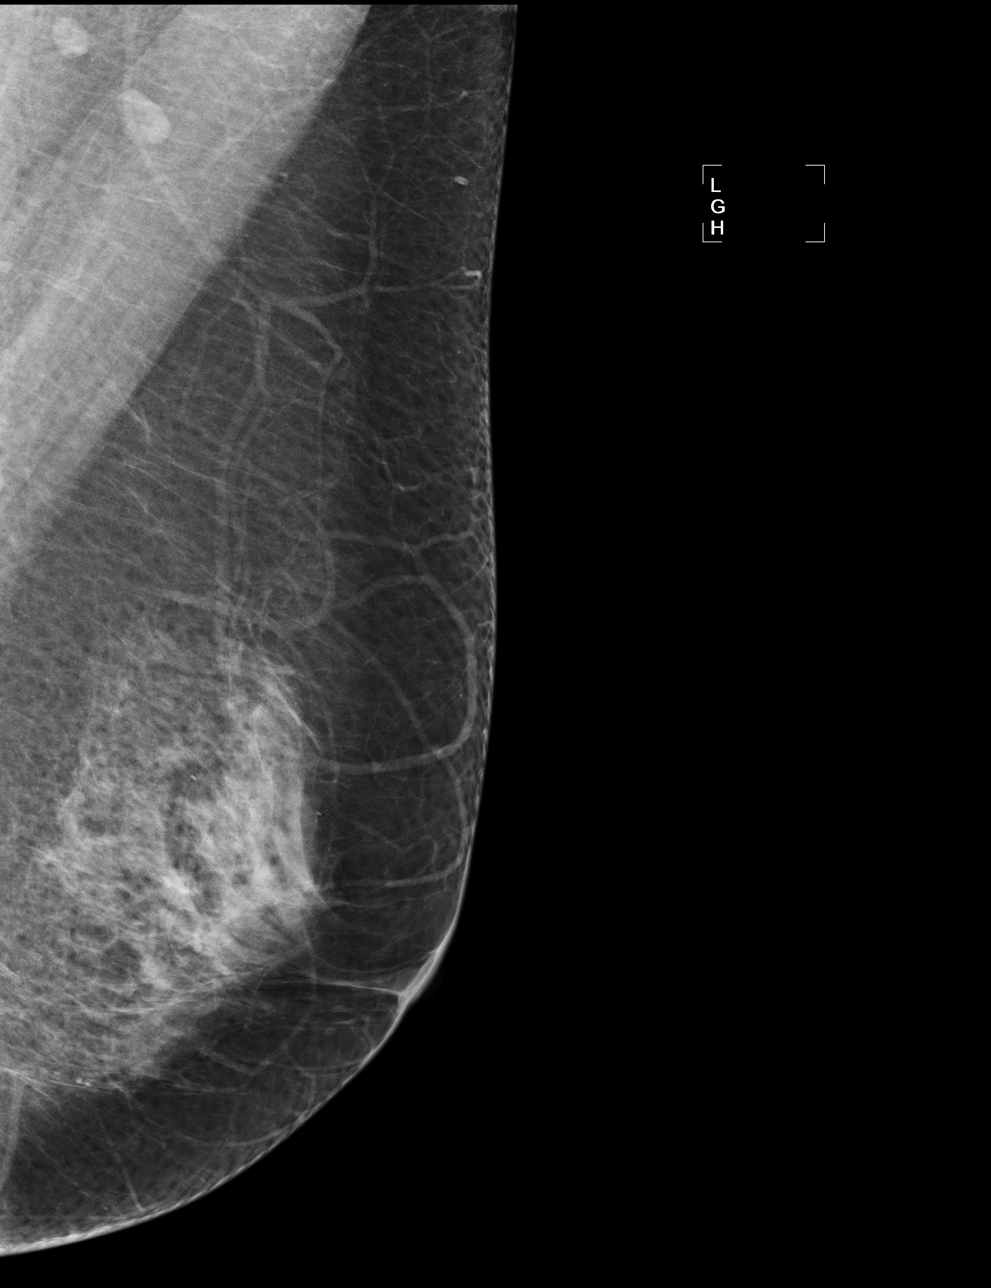

[R MLO]
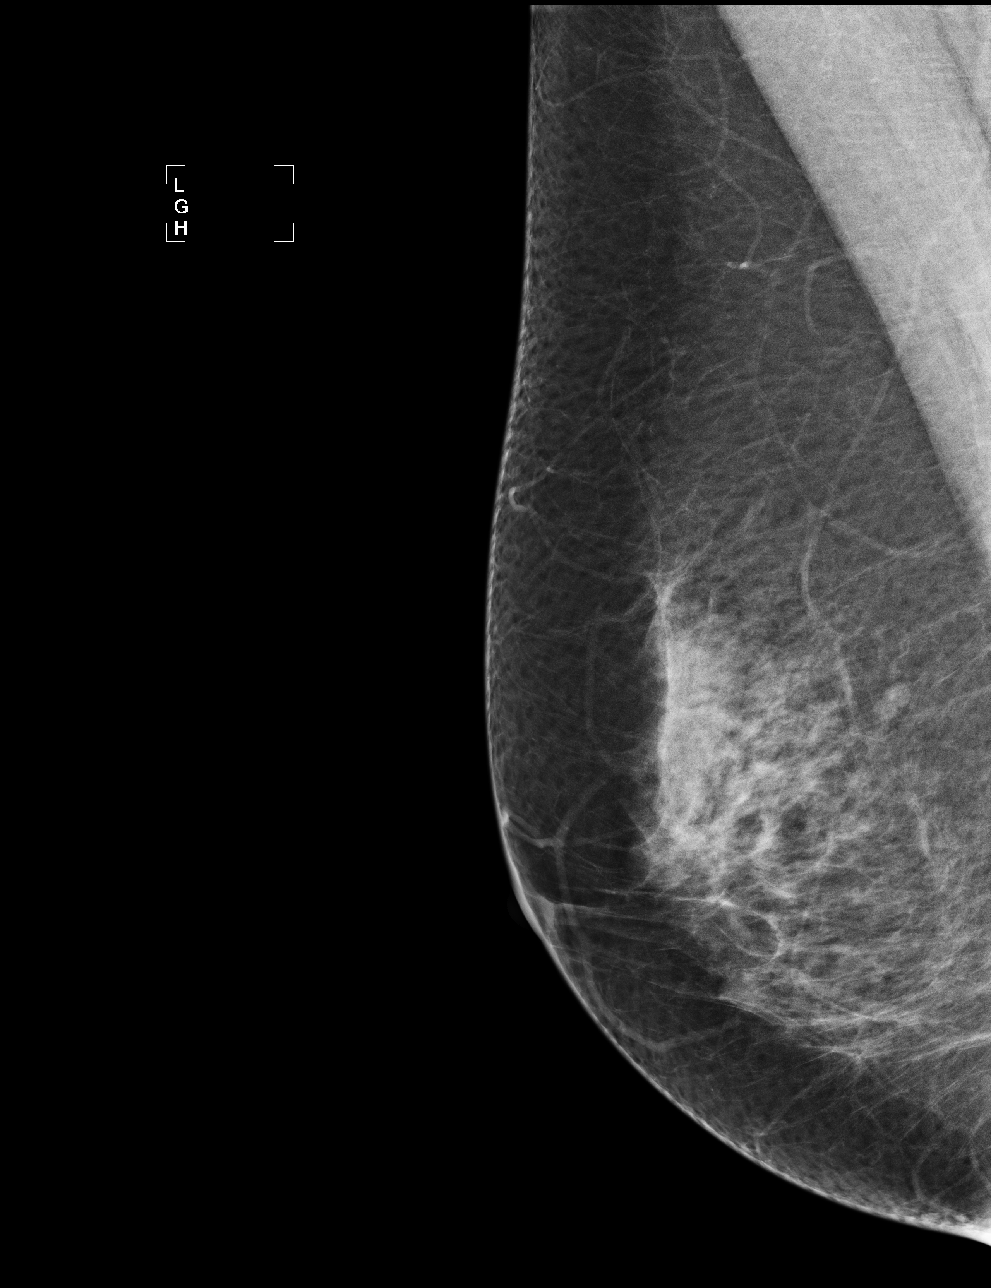

[4 of 4 positions shown; findings below may reference images not displayed]

ACR Breast Density Category c: The breast tissue is heterogeneously
dense, which may obscure small masses.
FINDINGS: There are no findings suspicious for malignancy. Images were
processed with CAD.
IMPRESSION: No mammographic evidence of malignancy. A result letter of this
screening mammogram will be mailed directly to the patient.

RECOMMENDATION:
Screening mammogram in one year. (Code:YJ-2-FEZ)

BI-RADS CATEGORY  1: Negative.

## 2017-02-27 ENCOUNTER — Other Ambulatory Visit: Payer: Self-pay | Admitting: Obstetrics & Gynecology

## 2017-02-27 DIAGNOSIS — Z1231 Encounter for screening mammogram for malignant neoplasm of breast: Secondary | ICD-10-CM

## 2017-03-04 ENCOUNTER — Ambulatory Visit: Payer: 59

## 2017-03-06 ENCOUNTER — Ambulatory Visit
Admission: RE | Admit: 2017-03-06 | Discharge: 2017-03-06 | Disposition: A | Discharge status: Home / Self Care | Payer: 59 | Source: Ambulatory Visit | Attending: Obstetrics & Gynecology | Admitting: Obstetrics & Gynecology

## 2017-03-06 DIAGNOSIS — Z1231 Encounter for screening mammogram for malignant neoplasm of breast: Secondary | ICD-10-CM

## 2018-08-05 ENCOUNTER — Other Ambulatory Visit: Payer: Self-pay | Admitting: Obstetrics & Gynecology

## 2018-08-05 DIAGNOSIS — Z1231 Encounter for screening mammogram for malignant neoplasm of breast: Secondary | ICD-10-CM

## 2018-08-06 ENCOUNTER — Ambulatory Visit
Admission: RE | Admit: 2018-08-06 | Discharge: 2018-08-06 | Disposition: A | Discharge status: Home / Self Care | Payer: 59 | Source: Ambulatory Visit | Attending: Obstetrics & Gynecology | Admitting: Obstetrics & Gynecology

## 2018-08-06 DIAGNOSIS — Z1231 Encounter for screening mammogram for malignant neoplasm of breast: Secondary | ICD-10-CM

## 2020-01-10 IMAGING — MG DIGITAL SCREENING BILATERAL MAMMOGRAM WITH CAD
1 series · 1 of 1 positions shown · non-contrast
Comparison: Previous exam(s).

CLINICAL DATA: Screening.

EXAM:
DIGITAL SCREENING BILATERAL MAMMOGRAM WITH CAD

[L MLO]
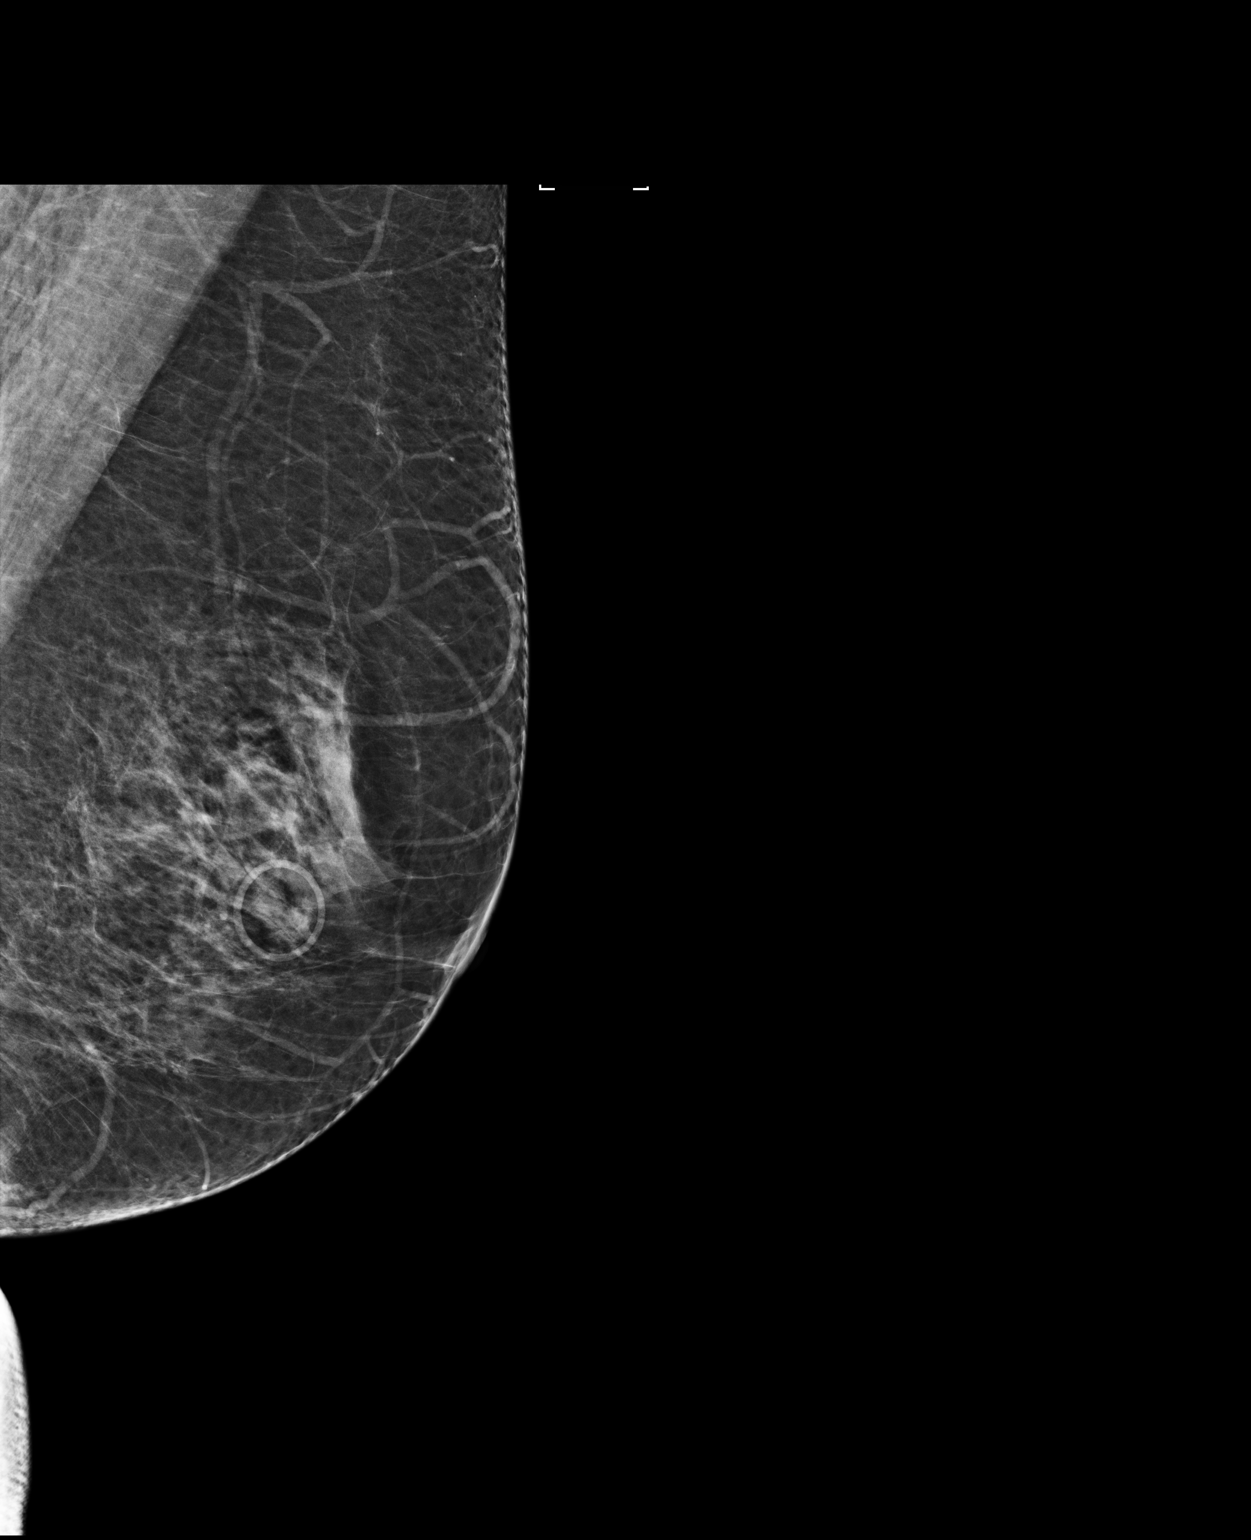

[1 of 1 positions shown; findings below may reference images not displayed]

ACR Breast Density Category b: There are scattered areas of
fibroglandular density.
FINDINGS: There are no findings suspicious for malignancy. Images were
processed with CAD.
IMPRESSION: No mammographic evidence of malignancy. A result letter of this
screening mammogram will be mailed directly to the patient.

RECOMMENDATION:
Screening mammogram in one year. (Code:AS-G-LCT)

BI-RADS CATEGORY  1: Negative.

## 2020-08-21 ENCOUNTER — Ambulatory Visit
Admission: EM | Admit: 2020-08-21 | Discharge: 2020-08-21 | Disposition: A | Discharge status: Home / Self Care | Payer: Commercial Managed Care - PPO | Attending: Emergency Medicine | Admitting: Emergency Medicine

## 2020-08-21 DIAGNOSIS — M545 Low back pain, unspecified: Secondary | ICD-10-CM

## 2020-08-21 DIAGNOSIS — J069 Acute upper respiratory infection, unspecified: Secondary | ICD-10-CM | POA: Diagnosis not present

## 2020-08-21 MED ORDER — TIZANIDINE HCL 4 MG PO TABS: 4.0000 mg | ORAL_TABLET | Freq: Four times a day (QID) | ORAL | 0 refills | Status: AC | PRN

## 2020-08-21 MED ORDER — BENZONATATE 200 MG PO CAPS: 200.0000 mg | ORAL_CAPSULE | Freq: Three times a day (TID) | ORAL | 0 refills | Status: AC | PRN

## 2020-08-21 MED ORDER — NAPROXEN 500 MG PO TABS: 500.0000 mg | ORAL_TABLET | Freq: Two times a day (BID) | ORAL | 0 refills | Status: AC

## 2020-08-21 NOTE — ED Triage Notes (Signed)
Pt reports that she fell on icy porch this morning onto her back and c/o pain to lower back area.  Pt also c/o non-productive cough, body aches x2 days and pt's husband tested positive for COVID on Friday. Reports fever 100.7 on Saturday. Reports mild sore throat onset while in waiting room.  Sneezing and abdominal cramping onset this morning. Denies  N/v/d. Took 1000mg  tylenol at 0900. No COVID vaccines. Coarse lung sounds bilateral bases.
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
## 2020-08-21 NOTE — Discharge Instructions (Signed)
Covid test pending, monitor my chart for results May use Tessalon for cough or other over-the-counter medicine for further cough and congestion relief Rest and fluids  Naprosyn twice daily with food for back pain Supplement tizanidine as needed at home/bedtime-this is a muscle relaxer, may cause drowsiness, do not drive or work after taking  Please follow-up if any symptoms not improving or worsening

## 2020-08-23 LAB — NOVEL CORONAVIRUS, NAA: SARS-CoV-2, NAA: DETECTED — AB

## 2020-08-23 LAB — SARS-COV-2, NAA 2 DAY TAT

## 2020-08-24 ENCOUNTER — Encounter: Payer: Self-pay | Admitting: Nurse Practitioner
